# Patient Record
Sex: Male | Born: 1955 | Race: White | Hispanic: Yes | Marital: Married | Smoking: Never smoker
Health system: Southern US, Community
[De-identification: ages and names within clinical notes are randomized; demographics above are authoritative.]

## PROBLEM LIST (undated history)

## (undated) DIAGNOSIS — E119 Type 2 diabetes mellitus without complications: Secondary | ICD-10-CM

## (undated) HISTORY — DX: Type 2 diabetes mellitus without complications: E11.9

---

## 2001-11-07 ENCOUNTER — Emergency Department (HOSPITAL_COMMUNITY): Admission: EM | Admit: 2001-11-07 | Discharge: 2001-11-07 | Payer: Self-pay | Admitting: Emergency Medicine

## 2001-11-07 ENCOUNTER — Encounter: Payer: Self-pay | Admitting: Emergency Medicine

## 2012-06-18 ENCOUNTER — Ambulatory Visit: Payer: Self-pay | Admitting: Emergency Medicine

## 2012-06-18 ENCOUNTER — Ambulatory Visit: Payer: Self-pay

## 2012-06-18 VITALS — BP 138/82 | HR 86 | Temp 98.4°F | Resp 16 | Ht 67.0 in | Wt 198.4 lb

## 2012-06-18 DIAGNOSIS — S61012A Laceration without foreign body of left thumb without damage to nail, initial encounter: Secondary | ICD-10-CM

## 2012-06-18 DIAGNOSIS — M25549 Pain in joints of unspecified hand: Secondary | ICD-10-CM

## 2012-06-18 DIAGNOSIS — S61209A Unspecified open wound of unspecified finger without damage to nail, initial encounter: Secondary | ICD-10-CM

## 2012-06-18 MED ORDER — HYDROCODONE-ACETAMINOPHEN 5-325 MG PO TABS: 1.0000 | ORAL_TABLET | ORAL | Status: DC | PRN

## 2012-06-18 MED ORDER — CEPHALEXIN 500 MG PO CAPS: 500.0000 mg | ORAL_CAPSULE | Freq: Four times a day (QID) | ORAL | Status: DC

## 2012-06-18 NOTE — Progress Notes (Signed)
   Patient ID: Gary Avila MRN: [id number], DOB: [birth year], 57 y.o. Date of Encounter: 06/18/2012, 6:51 PM   PROCEDURE NOTE: Verbal consent obtained.  No FB. Flap repaired with # 4 simple interrupted sutures of 4-0 Ethilon.  Penrose removed.  Pressure dressing applied.  Wound care instructions including precautions covered with patient. Handout given.  Anticipate suture removal in 10 days.   SignedEula Listen, PA-C 06/18/2012 6:51 PM

## 2012-06-18 NOTE — Progress Notes (Signed)
[facility] [address], [place] [address] [phone]  Date:  06/18/2012   Name:  Gary Avila   DOB:  Jul 07, 1955   MRN:  [id number]  PCP:  No primary provider on file.    Chief Complaint: Laceration   History of Present Illness:  Gary Avila is a 57 y.o. very pleasant male patient who presents with the following:  Injured at home with a router and a small bit that jumped and ran across his left thumb on the flexor surface.  Up to date on tetanus.  There is no problem list on file for this patient.   Past Medical History  Diagnosis Date  . Diabetes mellitus without complication     No past surgical history on file.  History  Substance Use Topics  . Smoking status: Never Smoker   . Smokeless tobacco: Not on file  . Alcohol Use: No    No family history on file.  No Known Allergies  Medication list has been reviewed and updated.  No current outpatient prescriptions on file prior to visit.   No current facility-administered medications on file prior to visit.    Review of Systems:  As per HPI, otherwise negative.    Physical Examination: Filed Vitals:   06/18/12 1655  BP: 138/82  Pulse: 86  Temp: 98.4 F (36.9 C)  Resp: 16   Filed Vitals:   06/18/12 1655  Height: 5\' 7"  (1.702 m)  Weight: 198 lb 6.4 oz (89.994 kg)   Body mass index is 31.07 kg/(m^2). Ideal Body Weight: Weight in (lb) to have BMI = 25: 159.3   GEN: WDWN, NAD, Non-toxic, Alert & Oriented x 3 HEENT: Atraumatic, Normocephalic.  Ears and Nose: No external deformity. EXTR: No clubbing/cyanosis/edema NEURO: Normal gait.  PSYCH: Normally interactive. Conversant. Not depressed or anxious appearing.  Calm demeanor.  Macerated flexor surface thumb distal phalange.  Contaminated.  NATI.  Total length 3 cm.  Proximal base flap.  Assessment and Plan: Laceration thumb  Digital block Irrigated and sharply debrided. Tourniquet applied after  exsanguination Repaired by Eula Listen   Signed,  Phillips Odor, MD   [facility] reading (PRIMARY) by  Dr. Dareen Piano.  No osseous injury or FB.

## 2012-06-20 ENCOUNTER — Ambulatory Visit (INDEPENDENT_AMBULATORY_CARE_PROVIDER_SITE_OTHER): Payer: Self-pay | Admitting: Physician Assistant

## 2012-06-20 VITALS — BP 120/78 | HR 80 | Temp 98.8°F | Resp 18 | Ht 66.5 in | Wt 195.0 lb

## 2012-06-20 DIAGNOSIS — Z5189 Encounter for other specified aftercare: Secondary | ICD-10-CM

## 2012-06-20 DIAGNOSIS — S61012D Laceration without foreign body of left thumb without damage to nail, subsequent encounter: Secondary | ICD-10-CM

## 2012-06-20 NOTE — Progress Notes (Signed)
  Subjective:    Patient ID: Gary Avila, male    DOB: 1955/08/21, 57 y.o.   MRN: [id number]  HPI   [name] is a very pleasant 57 yr old male here for follow up on a thumb injury sustained 2 days ago.  See previous note for details of injury.  Today pt states he is doing well though he does have throbbing pain in the thumb especially when his arm is down at his side.  He has kept the area clean and dry.  Original dressing is intact.      Review of Systems  Skin: Positive for wound.  All other systems reviewed and are negative.       Objective:   Physical Exam  Vitals reviewed. Constitutional: He is oriented to person, place, and time. He appears well-developed and well-nourished. No distress.  HENT:  Head: Normocephalic and atraumatic.  Eyes: Conjunctivae are normal. No scleral icterus.  Pulmonary/Chest: Effort normal.  Musculoskeletal:       Left hand: He exhibits laceration. Normal sensation noted.       Hands: Large open wound over distal aspect of left thumb, #4 sutures in place; wound bed beefy red and healthy; granulation tissue visible  Neurological: He is alert and oriented to person, place, and time.  Skin: Skin is warm and dry.  Psychiatric: He has a normal mood and affect. His behavior is normal.          Assessment & Plan:  Laceration of thumb, left, subsequent encounter   [name] is a very pleasant 57 yr old male here for follow-up on a thumb laceration.  Wound appears healthy with good granulation tissue.  I pulled some of the suture tails free from granulation tissue that had grown over top.  Xeroform dressing reapplied.  Also placed pt in fold-over splint to prevent bending at the DIP.  Will have pt RTC Monday 06/23/12 for recheck.  Anticipate suture removal in approx 8 more days.

## 2012-06-20 NOTE — Patient Instructions (Addendum)
Keep the dressing in place.  Use the splint to help keep your thumb from bending.  Come back on Monday so we can recheck you, sooner if anything is getting worse.

## 2012-06-23 ENCOUNTER — Encounter: Payer: Self-pay | Admitting: Family Medicine

## 2012-06-23 ENCOUNTER — Ambulatory Visit (INDEPENDENT_AMBULATORY_CARE_PROVIDER_SITE_OTHER): Payer: Self-pay | Admitting: Physician Assistant

## 2012-06-23 VITALS — BP 136/88 | HR 73 | Temp 98.5°F | Resp 16 | Ht 66.0 in | Wt 195.0 lb

## 2012-06-23 DIAGNOSIS — M79645 Pain in left finger(s): Secondary | ICD-10-CM

## 2012-06-23 DIAGNOSIS — M79609 Pain in unspecified limb: Secondary | ICD-10-CM

## 2012-06-23 DIAGNOSIS — Z5189 Encounter for other specified aftercare: Secondary | ICD-10-CM

## 2012-06-23 DIAGNOSIS — S61012D Laceration without foreign body of left thumb without damage to nail, subsequent encounter: Secondary | ICD-10-CM

## 2012-06-23 NOTE — Progress Notes (Signed)
   [address] [place] [address]   Phone [phone]  Subjective:    Patient ID: Gary Avila, male    DOB: 1956/02/09, 57 y.o.   MRN: [id number]  HPI  Pt presents to clinic for f/u.  Less pain - he has not changed the drsg.  Review of Systems  Constitutional: Negative for fever and chills.  Skin: Positive for wound.       Objective:   Physical Exam  Vitals reviewed. Constitutional: He is oriented to person, place, and time. He appears well-developed and well-nourished.  HENT:  Head: Normocephalic and atraumatic.  Right Ear: External ear normal.  Left Ear: External ear normal.  Pulmonary/Chest: Effort normal.  Neurological: He is alert and oriented to person, place, and time.  Skin: Skin is warm.  Healing wound with good granulation tissue formation.  Ends of sutures are removed from the healing wound.  Intact skin edges are macerated from xeroform drsg.  No erythema or other signs of infection.  Psychiatric: He has a normal mood and affect. His behavior is normal. Judgment and thought content normal.       Assessment & Plan:  Thumb laceration, left, subsequent encounter Non-stick drsg placed on wound - pt to continue fold over splint for pain protection.  Change drsg daily.  Recheck on Friday for probably suture removal.  Early if concerns about wound.  Pain of left thumb  Benny Lennert [facility] 06/23/2012 11:32 AM

## 2012-06-25 ENCOUNTER — Ambulatory Visit (INDEPENDENT_AMBULATORY_CARE_PROVIDER_SITE_OTHER): Payer: Self-pay | Admitting: Physician Assistant

## 2012-06-25 VITALS — BP 132/84 | HR 84 | Temp 98.6°F | Resp 18 | Ht 66.0 in | Wt 194.0 lb

## 2012-06-25 DIAGNOSIS — Z5189 Encounter for other specified aftercare: Secondary | ICD-10-CM

## 2012-06-25 DIAGNOSIS — S61012D Laceration without foreign body of left thumb without damage to nail, subsequent encounter: Secondary | ICD-10-CM

## 2012-06-25 DIAGNOSIS — S61209A Unspecified open wound of unspecified finger without damage to nail, initial encounter: Secondary | ICD-10-CM

## 2012-06-25 NOTE — Progress Notes (Signed)
   [address], [place] [address]   Phone [phone]  Subjective:    Patient ID: Gary Avila, male    DOB: 21-Feb-1956, 57 y.o.   MRN: [id number]  HPI Pt presents to clinic for recheck. He is doing well.  He has been changing the drsg daily.   Review of Systems     Objective:   Physical Exam  Constitutional: He is oriented to person, place, and time. He appears well-developed and well-nourished.  HENT:  Head: Normocephalic and atraumatic.  Right Ear: External ear normal.  Left Ear: External ear normal.  Pulmonary/Chest: Effort normal.  Neurological: He is alert and oriented to person, place, and time.  Skin: Skin is warm and dry.  L thumb drsg removed.  Suture tails are again embedded in the granulation tissue.  I removed #1 suture at the thumb base.  Xeroform gauze placed over the wound.  Non-stick drsg placed with fold over splint.  Psychiatric: He has a normal mood and affect. His behavior is normal. Judgment and thought content normal.       Assessment & Plan:  Wound thumb - continue wearing splint.  Recheck on Friday Benny Lennert PA-C 06/25/2012 1:17 PM

## 2012-06-27 ENCOUNTER — Ambulatory Visit (INDEPENDENT_AMBULATORY_CARE_PROVIDER_SITE_OTHER): Payer: Self-pay | Admitting: Physician Assistant

## 2012-06-27 VITALS — HR 72 | Temp 98.2°F | Resp 18 | Ht 66.0 in | Wt 194.0 lb

## 2012-06-27 DIAGNOSIS — S61012D Laceration without foreign body of left thumb without damage to nail, subsequent encounter: Secondary | ICD-10-CM

## 2012-06-27 DIAGNOSIS — Z5189 Encounter for other specified aftercare: Secondary | ICD-10-CM

## 2012-06-27 NOTE — Progress Notes (Signed)
   [address] [place] [address]   Phone [phone]  Subjective:    Patient ID: Gary Avila, male    DOB: 18-Mar-1955, 57 y.o.   MRN: [id number]  HPI  Pt presents to clinic for wound recheck.  He has not changed the drsg.  Review of Systems     Objective:   Physical Exam  Vitals reviewed. Constitutional: He is oriented to person, place, and time. He appears well-developed and well-nourished.  Pulmonary/Chest: Effort normal.  Neurological: He is alert and oriented to person, place, and time.  Skin: Skin is warm and dry.  Drsg removed.  Wound is healing well.  Less beefy red granulation tissue present - good new pink tissue at the wound edges.  Stitches removed and tip of thumb is still loose due to injury but more stable than it was last week.  Xeroform drsg placed.    Psychiatric: He has a normal mood and affect. His behavior is normal. Judgment and thought content normal.          Assessment & Plan:  Laceration of left thumb, subsequent encounter -  Pt to change drsg and xerform at leasdt every other day - once there is pink tissue covering the wound he can stop the xeroform and just use dry bandage.  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  Pt understands and agrees.  Benny Lennert PA-C 06/27/2012 12:51 PM

## 2016-07-11 ENCOUNTER — Ambulatory Visit (HOSPITAL_COMMUNITY)
Admission: RE | Admit: 2016-07-11 | Discharge: 2016-07-11 | Disposition: A | Discharge status: Home / Self Care | Payer: Self-pay | Source: Ambulatory Visit | Attending: Physician Assistant | Admitting: Physician Assistant

## 2016-07-11 ENCOUNTER — Encounter: Payer: Self-pay | Admitting: Physician Assistant

## 2016-07-11 ENCOUNTER — Ambulatory Visit (INDEPENDENT_AMBULATORY_CARE_PROVIDER_SITE_OTHER): Payer: Self-pay | Admitting: Physician Assistant

## 2016-07-11 ENCOUNTER — Encounter (HOSPITAL_COMMUNITY): Payer: Self-pay

## 2016-07-11 VITALS — BP 119/83 | HR 79 | Temp 99.2°F | Resp 20 | Ht 66.0 in | Wt 189.2 lb

## 2016-07-11 DIAGNOSIS — R000 Tachycardia, unspecified: Secondary | ICD-10-CM

## 2016-07-11 DIAGNOSIS — E118 Type 2 diabetes mellitus with unspecified complications: Secondary | ICD-10-CM

## 2016-07-11 DIAGNOSIS — R918 Other nonspecific abnormal finding of lung field: Secondary | ICD-10-CM | POA: Insufficient documentation

## 2016-07-11 DIAGNOSIS — R42 Dizziness and giddiness: Secondary | ICD-10-CM

## 2016-07-11 DIAGNOSIS — R9431 Abnormal electrocardiogram [ECG] [EKG]: Secondary | ICD-10-CM

## 2016-07-11 DIAGNOSIS — R739 Hyperglycemia, unspecified: Secondary | ICD-10-CM

## 2016-07-11 DIAGNOSIS — J9811 Atelectasis: Secondary | ICD-10-CM | POA: Insufficient documentation

## 2016-07-11 DIAGNOSIS — I700 Atherosclerosis of aorta: Secondary | ICD-10-CM | POA: Insufficient documentation

## 2016-07-11 LAB — POCT CBC
Granulocyte percent: 89.4 % — AB (ref 37–80)
HCT, POC: 42.2 % — AB (ref 43.5–53.7)
Hemoglobin: 14.5 g/dL (ref 14.1–18.1)
Lymph, poc: 0.5 — AB (ref 0.6–3.4)
MCH, POC: 27.9 pg (ref 27–31.2)
MCHC: 34.4 g/dL (ref 31.8–35.4)
MCV: 81.2 fL (ref 80–97)
MID (cbc): 0.1 (ref 0–0.9)
MPV: 9.2 fL (ref 0–99.8)
POC Granulocyte: 4.9 (ref 2–6.9)
POC LYMPH PERCENT: 8.3 % — AB (ref 10–50)
POC MID %: 2.3 %M (ref 0–12)
Platelet Count, POC: 144 10*3/uL (ref 142–424)
RBC: 5.2 M/uL (ref 4.69–6.13)
RDW, POC: 13.7 %
WBC: 5.5 10*3/uL (ref 4.6–10.2)

## 2016-07-11 LAB — CMP14+EGFR
ALT: 27 IU/L (ref 0–44)
AST: 24 IU/L (ref 0–40)
Albumin/Globulin Ratio: 1.7 (ref 1.2–2.2)
Albumin: 4.3 g/dL (ref 3.5–5.5)
Alkaline Phosphatase: 89 IU/L (ref 39–117)
BUN/Creatinine Ratio: 17 (ref 9–20)
BUN: 18 mg/dL (ref 6–24)
Bilirubin Total: 0.6 mg/dL (ref 0.0–1.2)
CO2: 20 mmol/L (ref 18–29)
Calcium: 8.9 mg/dL (ref 8.7–10.2)
Chloride: 98 mmol/L (ref 96–106)
Creatinine, Ser: 1.03 mg/dL (ref 0.76–1.27)
GFR calc Af Amer: 92 mL/min/{1.73_m2} (ref >59)
GFR calc non Af Amer: 80 mL/min/{1.73_m2} (ref >59)
Globulin, Total: 2.5 g/dL (ref 1.5–4.5)
Glucose: 223 mg/dL — ABNORMAL HIGH (ref 65–99)
Potassium: 3.9 mmol/L (ref 3.5–5.2)
Sodium: 137 mmol/L (ref 134–144)
Total Protein: 6.8 g/dL (ref 6.0–8.5)

## 2016-07-11 LAB — POC MICROSCOPIC URINALYSIS (UMFC): Mucus: ABSENT

## 2016-07-11 LAB — POCT URINALYSIS DIP (MANUAL ENTRY)
Bilirubin, UA: NEGATIVE
Glucose, UA: 100 mg/dL — AB
Ketones, POC UA: NEGATIVE mg/dL
Nitrite, UA: NEGATIVE
Protein Ur, POC: NEGATIVE mg/dL
Spec Grav, UA: 1.015 (ref 1.010–1.025)
Urobilinogen, UA: 0.2 E.U./dL
pH, UA: 5 (ref 5.0–8.0)

## 2016-07-11 LAB — HEMOGLOBIN A1C
Est. average glucose Bld gHb Est-mCnc: 229 mg/dL
Hgb A1c MFr Bld: 9.6 % — ABNORMAL HIGH (ref 4.8–5.6)

## 2016-07-11 LAB — D-DIMER, QUANTITATIVE: D-DIMER: 3.16 mg{FEU}/L — ABNORMAL HIGH (ref 0.00–0.49)

## 2016-07-11 LAB — POCT I-STAT CREATININE: Creatinine, Ser: 0.9 mg/dL (ref 0.61–1.24)

## 2016-07-11 LAB — GLUCOSE, POCT (MANUAL RESULT ENTRY): POC Glucose: 219 mg/dl — AB (ref 70–99)

## 2016-07-11 MED ORDER — IOPAMIDOL (ISOVUE-370) INJECTION 76%
INTRAVENOUS | Status: AC
  Administered 2016-07-11: 100 mL
  Filled 2016-07-11: qty 100

## 2016-07-11 MED ORDER — METFORMIN HCL 500 MG PO TABS: 500.0000 mg | ORAL_TABLET | Freq: Two times a day (BID) | ORAL | 3 refills | Status: DC

## 2016-07-11 NOTE — Patient Instructions (Addendum)
Please come back tomorrow and se Dr. Mitchel Honour.   Please start your Metformin: Metformin Dosing (to be taken with food) Week 1: take 1/2 tablet twice a day. Week 2: take 1 tablet in the morning, 1/2 tablet at night. Week 3: take 2 tablets twice a day  Come back and see me in 2 weeks.   Thank you for coming in today. I hope you feel we met your needs.  Feel free to call [facility] if you have any questions or further requests.  Please consider signing up for MyChart if you do not already have it, as this is a great way to communicate with me.  Best,  Whitney McVey, PA-C   Carbohydrate Counting for Diabetes Mellitus, Adult Carbohydrate counting is a method for keeping track of how many carbohydrates you eat. Eating carbohydrates naturally increases the amount of sugar (glucose) in the blood. Counting how many carbohydrates you eat helps keep your blood glucose within normal limits, which helps you manage your diabetes (diabetes mellitus). It is important to know how many carbohydrates you can safely have in each meal. This is different for every person. A diet and nutrition specialist (registered dietitian) can help you make a meal plan and calculate how many carbohydrates you should have at each meal and snack. Carbohydrates are found in the following foods:  Grains, such as breads and cereals.  Dried beans and soy products.  Starchy vegetables, such as potatoes, peas, and corn.  Fruit and fruit juices.  Milk and yogurt.  Sweets and snack foods, such as cake, cookies, candy, chips, and soft drinks. How do I count carbohydrates? There are two ways to count carbohydrates in food. You can use either of the methods or a combination of both. Reading "Nutrition Facts" on packaged food  The "Nutrition Facts" list is included on the labels of almost all packaged foods and beverages in the [place] It includes:  The serving size.  Information about nutrients in each serving, including the grams  (g) of carbohydrate per serving. To use the "Nutrition Facts":  Decide how many servings you will have.  Multiply the number of servings by the number of carbohydrates per serving.  The resulting number is the total amount of carbohydrates that you will be having. Learning standard serving sizes of other foods  When you eat foods containing carbohydrates that are not packaged or do not include "Nutrition Facts" on the label, you need to measure the servings in order to count the amount of carbohydrates:  Measure the foods that you will eat with a food scale or measuring cup, if needed.  Decide how many standard-size servings you will eat.  Multiply the number of servings by 15. Most carbohydrate-rich foods have about 15 g of carbohydrates per serving.  For example, if you eat 8 oz (170 g) of strawberries, you will have eaten 2 servings and 30 g of carbohydrates (2 servings x 15 g = 30 g).  For foods that have more than one food mixed, such as soups and casseroles, you must count the carbohydrates in each food that is included. The following list contains standard serving sizes of common carbohydrate-rich foods. Each of these servings has about 15 g of carbohydrates:   hamburger bun or  English muffin.   oz (15 mL) syrup.   oz (14 g) jelly.  1 slice of bread.  1 six-inch tortilla.  3 oz (85 g) cooked rice or pasta.  4 oz (113 g) cooked dried beans.  4 oz (113 g) starchy vegetable, such as peas, corn, or potatoes.  4 oz (113 g) hot cereal.  4 oz (113 g) mashed potatoes or  of a large baked potato.  4 oz (113 g) canned or frozen fruit.  4 oz (120 mL) fruit juice.  4-6 crackers.  6 chicken nuggets.  6 oz (170 g) unsweetened dry cereal.  6 oz (170 g) plain fat-free yogurt or yogurt sweetened with artificial sweeteners.  8 oz (240 mL) milk.  8 oz (170 g) fresh fruit or one small piece of fruit.  24 oz (680 g) popped popcorn. Example of carbohydrate  counting Sample meal   3 oz (85 g) chicken breast.  6 oz (170 g) brown rice.  4 oz (113 g) corn.  8 oz (240 mL) milk.  8 oz (170 g) strawberries with sugar-free whipped topping. Carbohydrate calculation  1. Identify the foods that contain carbohydrates:  Rice.  Corn.  Milk.  Strawberries. 2. Calculate how many servings you have of each food:  2 servings rice.  1 serving corn.  1 serving milk.  1 serving strawberries. 3. Multiply each number of servings by 15 g:  2 servings rice x 15 g = 30 g.  1 serving corn x 15 g = 15 g.  1 serving milk x 15 g = 15 g.  1 serving strawberries x 15 g = 15 g. 4. Add together all of the amounts to find the total grams of carbohydrates eaten:  30 g + 15 g + 15 g + 15 g = 75 g of carbohydrates total. This information is not intended to replace advice given to you by your health care provider. Make sure you discuss any questions you have with your health care provider. Document Released: 02/19/2005 Document Revised: 09/09/2015 Document Reviewed: 08/03/2015 Elsevier Interactive Patient Education  2017 [employer].  IF you received an x-ray today, you will receive an invoice from [facility]. Please contact [facility] at [phone] with questions or concerns regarding your invoice.   IF you received labwork today, you will receive an invoice from [facility]. Please contact [facility] at [phone] with questions or concerns regarding your invoice.   Our billing staff will not be able to assist you with questions regarding bills from these companies.  You will be contacted with the lab results as soon as they are available. The fastest way to get your results is to activate your My Chart account. Instructions are located on the last page of this paperwork. If you have not heard from [place] regarding the results in 2 weeks, please contact this office.

## 2016-07-11 NOTE — Progress Notes (Signed)
Pt will f/u with Dr. Alvy BimlerSagardia tomorrow to discuss results and reassess his clinical condition.

## 2016-07-11 NOTE — Progress Notes (Signed)
Gary Avila  MRN: [id number] DOB: June 09, 1955  PCP: Patient, No Pcp Per  Subjective:  Pt is a 61 year old male who presents to clinic for dizziness that started this morning.  He was at work this morning when he felt lightheaded, weak and dizzy. He stopped working to sit down. At that time he felt cold chills with trembling in his hands. He endorses abnormal gait due to dizziness. He has not taken anything to feel better.  No recent medication changes.    Denies chest pain, neck pain, arm pain, cough, pain with deep inspiration, abdominal pain, one-sided weakness, one-sided numbness, facial numbness, difficulty with speech, vision changes, headache, n/v/d.   H/o diabetes - has not taken Metformin in about 6 months.   Review of Systems  Constitutional: Positive for chills. Negative for diaphoresis and fever.  Respiratory: Negative for cough, chest tightness, shortness of breath and wheezing.   Cardiovascular: Negative for chest pain, palpitations and leg swelling.  Gastrointestinal: Negative for diarrhea, nausea and vomiting.  Musculoskeletal: Positive for gait problem (due to dizziness). Negative for neck pain.  Neurological: Positive for dizziness and tremors. Negative for syncope, light-headedness and headaches.  Psychiatric/Behavioral: The patient is not nervous/anxious.     There are no active problems to display for this patient.   Current Outpatient Prescriptions on File Prior to Visit  Medication Sig Dispense Refill  . cephALEXin (KEFLEX) 500 MG capsule Take 1 capsule (500 mg total) by mouth 4 (four) times daily. (Patient not taking: Reported on 07/11/2016) 30 capsule 0  . HYDROcodone-acetaminophen (NORCO) 5-325 MG per tablet Take 1 tablet by mouth every 4 (four) hours as needed. (Patient not taking: Reported on 07/11/2016) 30 tablet 0  . metFORMIN (GLUCOPHAGE) 500 MG tablet Take 500 mg by mouth 2 (two) times daily with a meal.    . terazosin (HYTRIN) 1 MG capsule Take  1 mg by mouth at bedtime.     No current facility-administered medications on file prior to visit.     No Known Allergies   Objective:  BP 119/83 (BP Location: Right Arm, Patient Position: Sitting, Cuff Size: Large)   Pulse 79   Temp 99.2 F (37.3 C) (Oral)   Resp 20   Ht '5\' 6"'$  (1.676 m)   Wt 189 lb 3.2 oz (85.8 kg)   SpO2 98%   BMI 30.54 kg/m   Physical Exam  Constitutional: He is oriented to person, place, and time and well-developed, well-nourished, and in no distress. No distress.  Eyes: EOM are normal. Pupils are equal, round, and reactive to light.  Cardiovascular: Regular rhythm, normal heart sounds and normal pulses.  Tachycardia present.   Pulmonary/Chest: Effort normal and breath sounds normal. No respiratory distress.  Neurological: He is alert and oriented to person, place, and time. He has normal sensation, normal strength and intact cranial nerves. He is not agitated and not disoriented. He displays no weakness, no tremor, facial symmetry, normal stance and normal speech. No sensory deficit. He has a normal Tandem Gait Test. He shows no pronator drift. Gait abnormal. Coordination normal. GCS score is 15.  Gait normal at recheck.   Skin: Skin is warm and dry.  Psychiatric: Mood, memory, affect and judgment normal.  Vitals reviewed.  Orthostatic VS for the past 24 hrs:  BP- Lying Pulse- Lying BP- Sitting Pulse- Sitting BP- Standing at 0 minutes Pulse- Standing at 0 minutes  07/11/16 1254 118/76 113 113/73 121 118/70 124    Results for orders placed  or performed in visit on 07/11/16  POCT glucose (manual entry)  Result Value Ref Range   POC Glucose 219 (A) 70 - 99 mg/dl  POCT Microscopic Urinalysis ([facility])  Result Value Ref Range   WBC,UR,HPF,POC Few (A) None WBC/hpf   RBC,UR,HPF,POC Moderate (A) None RBC/hpf   Bacteria None None, Too numerous to count   Mucus Absent Absent   Epithelial Cells, UR Per Microscopy Few (A) None, Too numerous to count cells/hpf    POCT urinalysis dipstick  Result Value Ref Range   Color, UA yellow yellow   Clarity, UA clear clear   Glucose, UA =100 (A) negative mg/dL   Bilirubin, UA negative negative   Ketones, POC UA negative negative mg/dL   Spec Grav, UA 1.015 1.010 - 1.025   Blood, UA small (A) negative   pH, UA 5.0 5.0 - 8.0   Protein Ur, POC negative negative mg/dL   Urobilinogen, UA 0.2 0.2 or 1.0 E.U./dL   Nitrite, UA Negative Negative   Leukocytes, UA Trace (A) Negative  POCT CBC  Result Value Ref Range   WBC 5.5 4.6 - 10.2 K/uL   Lymph, poc 0.5 (A) 0.6 - 3.4   POC LYMPH PERCENT 8.3 (A) 10 - 50 %L   MID (cbc) 0.1 0 - 0.9   POC MID % 2.3 0 - 12 %M   POC Granulocyte 4.9 2 - 6.9   Granulocyte percent 89.4 (A) 37 - 80 %G   RBC 5.20 4.69 - 6.13 M/uL   Hemoglobin 14.5 14.1 - 18.1 g/dL   HCT, POC 42.2 (A) 43.5 - 53.7 %   MCV 81.2 80 - 97 fL   MCH, POC 27.9 27 - 31.2 pg   MCHC 34.4 31.8 - 35.4 g/dL   RDW, POC 13.7 %   Platelet Count, POC 144 142 - 424 K/uL   MPV 9.2 0 - 99.8 fL    EKG - nsr, tachycardia. Changes in S1 and T3.  Assessment and Plan :  This case was discussed with Dr. Mitchel Honour.  1. Dizziness 2. Tachycardia 3. Nonspecific abnormal electrocardiogram (ECG) (EKG) - POCT glucose (manual entry) - CMP14+EGFR - EKG 12-Lead - POCT Microscopic Urinalysis ([facility]) - POCT urinalysis dipstick - Urine culture - Orthostatic vital signs - POCT CBC - Creatinine, IStat; Future - D-dimer, quantitative (not at [facility]) - CT Angio Chest W/Cm &/Or Wo Cm; Future - Creatinine, IStat; Future - Pt reports improvement of symptoms during his time here - Dizziness improved, as did unsteady gait. Not concerned today for neuro etiology. Will send for stat CT angio due to persistent tachycardia and EKG abnormalities to r/o pulmonary embolism. Labs are pending. F/u tomorrow with Dr. Mitchel Honour.  4. Hyperglycemia 5. Type 2 diabetes mellitus with complication, without long-term current use of insulin (HCC) -  Hemoglobin A1c - metFORMIN (GLUCOPHAGE) 500 MG tablet; Take 1 tablet (500 mg total) by mouth 2 (two) times daily with a meal.  Dispense: 60 tablet; Refill: 3 - Blood sugar is elevated. Possible his uncotrolled blood sugar levels are causing Mr. Ethel Rana symptoms.  Will send A1C as well. Plan to taper up on Metformin. RTC in 2 weeks for recheck.   Mercer Pod, PA-C  [facility] 07/11/2016 12:00 PM

## 2016-07-12 ENCOUNTER — Encounter: Payer: Self-pay | Admitting: Emergency Medicine

## 2016-07-12 ENCOUNTER — Ambulatory Visit (INDEPENDENT_AMBULATORY_CARE_PROVIDER_SITE_OTHER): Payer: Self-pay | Admitting: Emergency Medicine

## 2016-07-12 VITALS — BP 109/71 | HR 82 | Temp 98.8°F | Resp 16 | Ht 66.0 in | Wt 190.2 lb

## 2016-07-12 DIAGNOSIS — E118 Type 2 diabetes mellitus with unspecified complications: Secondary | ICD-10-CM

## 2016-07-12 DIAGNOSIS — R42 Dizziness and giddiness: Secondary | ICD-10-CM | POA: Insufficient documentation

## 2016-07-12 DIAGNOSIS — R000 Tachycardia, unspecified: Secondary | ICD-10-CM

## 2016-07-12 LAB — PLEASE NOTE

## 2016-07-12 LAB — URINE CULTURE

## 2016-07-12 MED ORDER — METFORMIN HCL 500 MG PO TABS: 500.0000 mg | ORAL_TABLET | Freq: Two times a day (BID) | ORAL | 3 refills | Status: AC

## 2016-07-12 NOTE — Progress Notes (Signed)
Gary Avila 61 y.o.   Chief Complaint  Patient presents with  . Follow-up    HISTORY OF PRESENT ILLNESS: This is a 61 y.o. male seen here yesterday with dizziness, tachycardia, and an abnormal EKG. Toiday much better and symptom-free. Has h/o DMT2 and was found to be hyperglycemic; has been off meds.  HPI   Prior to Admission medications   Medication Sig Start Date End Date Taking? Authorizing Provider  metFORMIN (GLUCOPHAGE) 500 MG tablet Take 1 tablet (500 mg total) by mouth 2 (two) times daily with a meal. Patient not taking: Reported on 07/12/2016 07/11/16   McVey, Madelaine BhatElizabeth Whitney, PA-C  terazosin (HYTRIN) 1 MG capsule Take 1 mg by mouth at bedtime.    [provider]    No Known Allergies  There are no active problems to display for this patient.   Past Medical History:  Diagnosis Date  . Diabetes mellitus without complication (HCC)     History reviewed. No pertinent surgical history.  Social History   Social History  . Marital status: Married    Spouse name: N/A  . Number of children: N/A  . Years of education: N/A   Occupational History  . Not on file.   Social History Main Topics  . Smoking status: Never Smoker  . Smokeless tobacco: Never Used  . Alcohol use No  . Drug use: No  . Sexual activity: Yes   Other Topics Concern  . Not on file   Social History Narrative  . No narrative on file    History reviewed. No pertinent family history.   ROS  Unremarkable  Vitals:   07/12/16 1705  BP: 109/71  Pulse: 82  Resp: 16  Temp: 98.8 F (37.1 C)    Physical Exam  Constitutional: He is oriented to person, place, and time. He appears well-developed and well-nourished.  HENT:  Head: Normocephalic and atraumatic.  Eyes: Pupils are equal, round, and reactive to light.  Neck: Normal range of motion.  Cardiovascular: Normal rate.   Pulmonary/Chest: Effort normal.  Musculoskeletal: Normal range of motion.  Neurological: He is  alert and oriented to person, place, and time.  Skin: Skin is warm and dry.  Psychiatric: He has a normal mood and affect. His behavior is normal.  Vitals reviewed.    ASSESSMENT & PLAN: [name] was seen today for follow-up.  Diagnoses and all orders for this visit:  Dizziness Comments: resolved  Type 2 diabetes mellitus with complication, without long-term current use of insulin (HCC) -     metFORMIN (GLUCOPHAGE) 500 MG tablet; Take 1 tablet (500 mg total) by mouth 2 (two) times daily with a meal.  Tachycardia Comments: resolved    Patient Instructions       IF you received an x-ray today, you will receive an invoice [facility]. Please contact [facility] at [phone] with questions or concerns regarding your invoice.   IF you received labwork today, you will receive an invoice from [facility]. Please contact [facility] at [phone] with questions or concerns regarding your invoice.   Our billing staff will not be able to assist you with questions regarding bills from these companies.  You will be contacted with the lab results as soon as they are available. The fastest way to get your results is to activate your My Chart account. Instructions are located on the last page of this paperwork. If you have not heard from us regarding the results in 2 weeks, please contact this office.  Diagnstico de la diabetes mellitus tipo2 en los adultos (Type 2 Diabetes Mellitus, Diagnosis, Adult) La diabetes tipo2 (diabetes mellitus tipo2) es una enfermedad de larga duracin (crnica). Puede deberse a uno de [employer] o a ambos:  El cuerpo no produce la cantidad suficiente de una hormona llamada insulina.  El cuerpo no reacciona de forma normal a la insulina que produce. La insulina permite que los ciertos azcares (glucosa) ingresen a las clulas del cuerpo. Esto le proporciona la energa. Cuando se tiene diabetes tipo2, la glucosa no pueden  ingresar a las clulas. Esto produce un aumento del nivel de glucosa en la sangre (hiperglucemia). El mdico fijar los objetivos del tratamiento para usted. Generalmente, los resultados de los niveles de glucosa en la sangre deben ser los siguientes:  Antes de las comidas (preprandial): de 80 a 130mg /dl (4,4 a 1,6XWRU/E).  Despus de las comidas (posprandial): por debajo de 180mg /dl (45WUJW/J).  Nivel deA1c (hemoglobinaA1c): menos del7%. CUIDADOS EN EL HOGAR Preguntas para hacerle al mdico Puede hacer las siguientes preguntas:  Debo reunirme con IT trainer para el cuidado de la diabetes?  Dnde puedo encontrar un grupo de apoyo para personas diabticas?  Qu equipos necesitar para cuidarme en casa?  Qu medicamentos para la diabetes necesito? Cundo debo tomarlos?  Con qu frecuencia debo controlarme el nivel de glucosa en la sangre?  A qu nmero puedo llamar si tengo preguntas?  Cundo es la prxima cita con el mdico? Instrucciones generales  [employer] medicamentos de venta libre y los recetados solamente como se lo haya indicado el mdico.  Oceanographer a todas las visitas de control como se lo haya indicado el mdico. Esto es importante. SOLICITE AYUDA SI:  El nivel de glucosa en la sangre es mayor o igual que 240mg /dl (19,1YNWG/NF) durante 2das seguidos.  Ha estado enfermo o ha tenido fiebre durante 2o ms [address]  [address] y no [place].  Si tiene alguno de [employer] durante ms de 6horas:  No puede comer ni beber.  Siente malestar estomacal (nuseas).  Vomita.  La materia fecal es lquida (diarrea). SOLICITE AYUDA DE INMEDIATO SI:  El nivel de glucosa en la sangre est por debajo de 54mg /dl (88mmol/l).  Est confundido.  Tiene dificultad para hacer lo siguiente:  Pensar con claridad.  Respirar.  Tiene niveles moderados o altos de cetonas en la [place]. Esta informacin no tiene Theme park manager el consejo del mdico. Asegrese de  hacerle al mdico cualquier pregunta que tenga. Document Released: 05/18/2008 Document Revised: 06/13/2015 Document Reviewed: 03/25/2015 Elsevier Interactive Patient Education  2017 Elsevier Inc.      Edwina Barth, MD [facility]

## 2016-07-12 NOTE — Patient Instructions (Addendum)
     IF you received an x-ray today, you will receive an invoice from [facility]. Please contact [facility] at [phone] with questions or concerns regarding your invoice.   IF you received labwork today, you will receive an invoice from [facility]. Please contact [facility] at [phone] with questions or concerns regarding your invoice.   Our billing staff will not be able to assist you with questions regarding bills from these companies.  You will be contacted with the lab results as soon as they are available. The fastest way to get your results is to activate your My Chart account. Instructions are located on the last page of this paperwork. If you have not heard from us regarding the results in 2 weeks, please contact this office.      Diagnstico de la diabetes mellitus tipo2 en los adultos (Type 2 Diabetes Mellitus, Diagnosis, Adult) La diabetes tipo2 (diabetes mellitus tipo2) es una enfermedad de larga duracin (crnica). Puede deberse a uno de estos problemas o a ambos:  El cuerpo no produce la cantidad suficiente de una hormona llamada insulina.  El cuerpo no reacciona de forma normal a la insulina que produce. La insulina permite que los ciertos azcares (glucosa) ingresen a las clulas del cuerpo. Esto le proporciona la energa. Cuando se tiene diabetes tipo2, la glucosa no pueden ingresar a las clulas. Esto produce un aumento del nivel de glucosa en la sangre (hiperglucemia). El mdico fijar los objetivos del tratamiento para usted. Generalmente, los resultados de los niveles de glucosa en la sangre deben ser los siguientes:  Antes de las comidas (preprandial): de 80 a 130mg/dl (4,4 a 7,2mmol/l).  Despus de las comidas (posprandial): por debajo de 180mg/dl (10mmol/l).  Nivel deA1c (hemoglobinaA1c): menos del7%. CUIDADOS EN EL HOGAR Preguntas para hacerle al mdico Puede hacer las siguientes preguntas:  Debo reunirme con un instructor  para el cuidado de la diabetes?  Dnde puedo encontrar un grupo de apoyo para personas diabticas?  Qu equipos necesitar para cuidarme en casa?  Qu medicamentos para la diabetes necesito? Cundo debo tomarlos?  Con qu frecuencia debo controlarme el nivel de glucosa en la sangre?  A qu nmero puedo llamar si tengo preguntas?  Cundo es la prxima cita con el mdico? Instrucciones generales  T[place] los medicamentos de venta libre y los recetados solamente como se lo haya indicado el mdico.  Concurra a todas las visitas de control como se lo haya indicado el mdico. Esto es importante. SOLICITE AYUDA SI:  El nivel de glucosa en la sangre es mayor o igual que 240mg/dl (13,3mmol/dl) durante 2das seguidos.  Ha estado enfermo o ha tenido fiebre durante 2o ms das y no mejora.  Si tiene alguno de estos problemas durante ms de 6horas:  No puede comer ni beber.  Siente malestar estomacal (nuseas).  Vomita.  La materia fecal es lquida (diarrea). SOLICITE AYUDA DE INMEDIATO SI:  El nivel de glucosa en la sangre est por debajo de 54mg/dl (3mmol/l).  Est confundido.  Tiene dificultad para hacer lo siguiente:  Pensar con claridad.  Respirar.  Tiene niveles moderados o altos de cetonas en la [place]. Esta informacin no tiene como fin reemplazar el consejo del mdico. Asegrese de hacerle al mdico cualquier pregunta que tenga. Document Released: 05/18/2008 Document Revised: 06/13/2015 Document Reviewed: 03/25/2015 Elsevier Interactive Patient Education  2017 Elsevier Inc.  

## 2018-08-19 IMAGING — CT CT ANGIO CHEST
2 of 6 series · 18 of 46 positions shown · IV contrast (APPLIED)
Comparison: None.

CLINICAL DATA: Shortness of breath.  Tachycardia.  Abnormal EKG.

EXAM:
CT ANGIOGRAPHY CHEST WITH CONTRAST
TECHNIQUE: Multidetector CT imaging of the chest was performed using the
standard protocol during bolus administration of intravenous
contrast. Multiplanar CT image reconstructions and MIPs were
obtained to evaluate the vascular anatomy.
CONTRAST:  100 mL Isovue 370

[Series 5: thins · axial · 0.76mm/px · z∈[-533,-282]mm · 16 of 275 slices shown]
[im 12/275  lung]
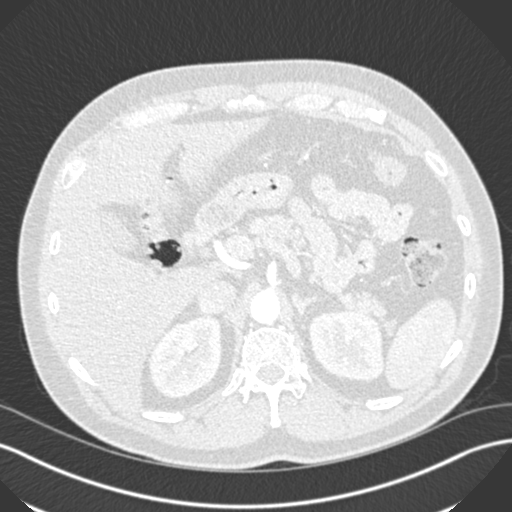
[im 36/275  soft-tissue]
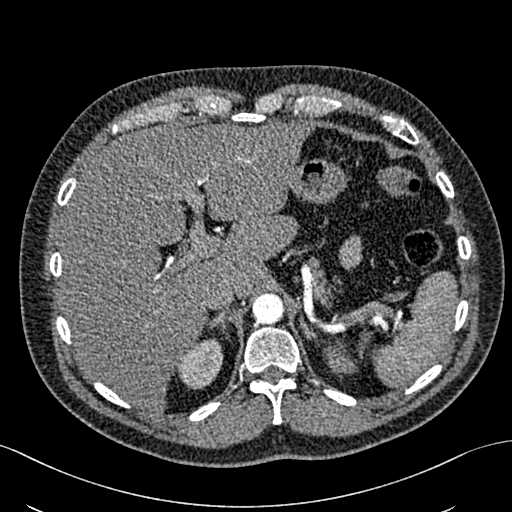
[im 48/275  lung]
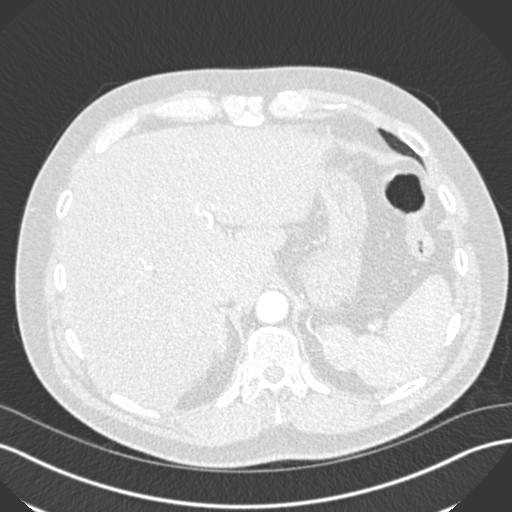
[im 60/275  soft-tissue]
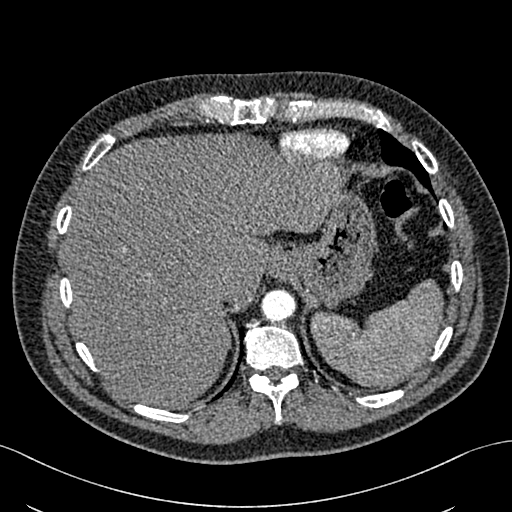
[im 84/275  lung]
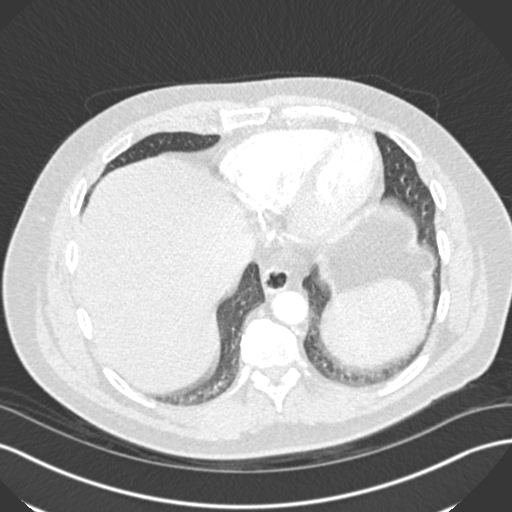
[im 96/275  soft-tissue]
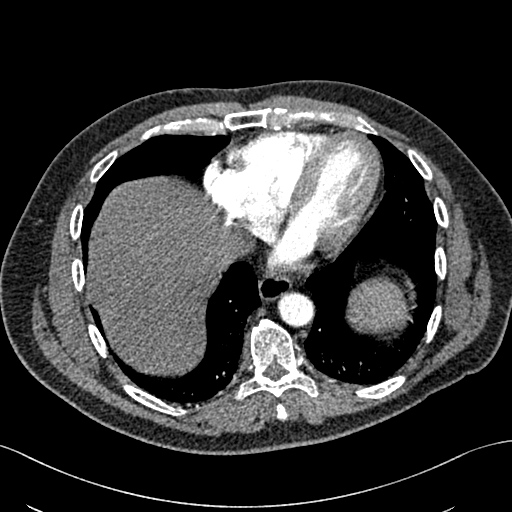
[im 108/275  lung]
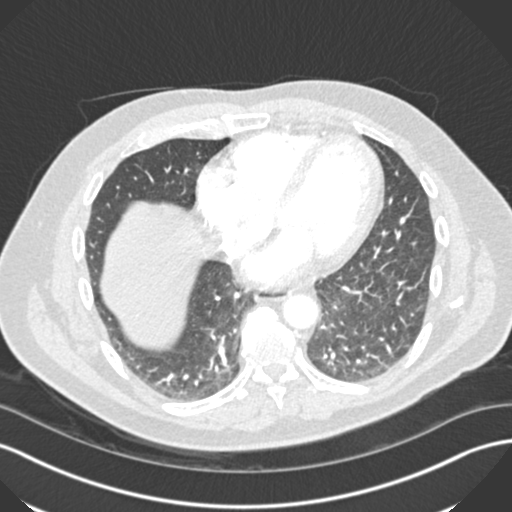
[im 132/275  soft-tissue]
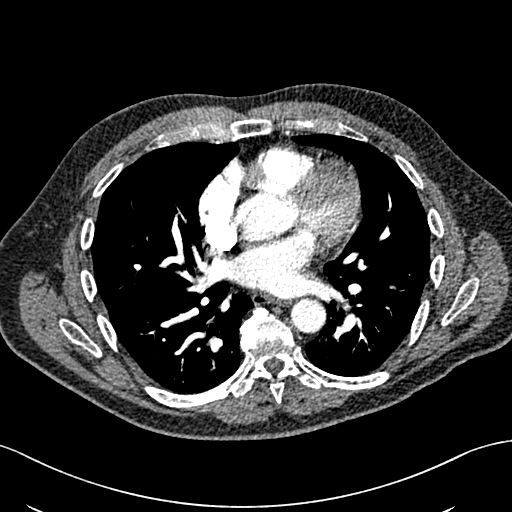
[im 143/275  lung]
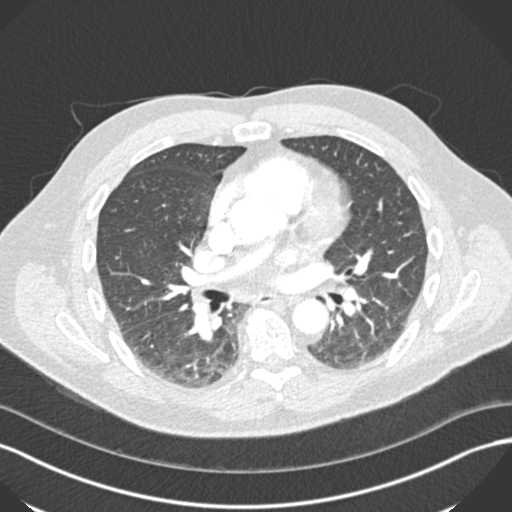
[im 167/275  soft-tissue]
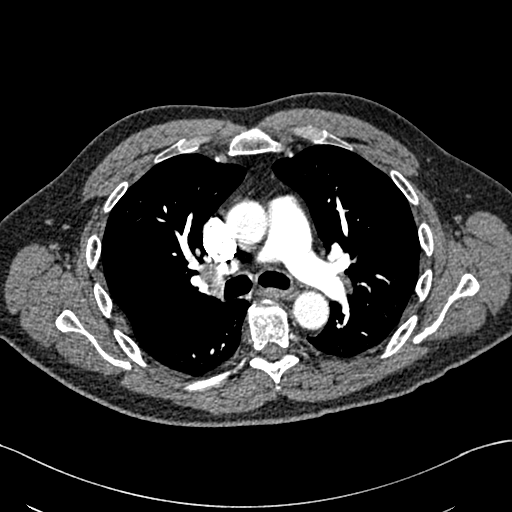
[im 179/275  lung]
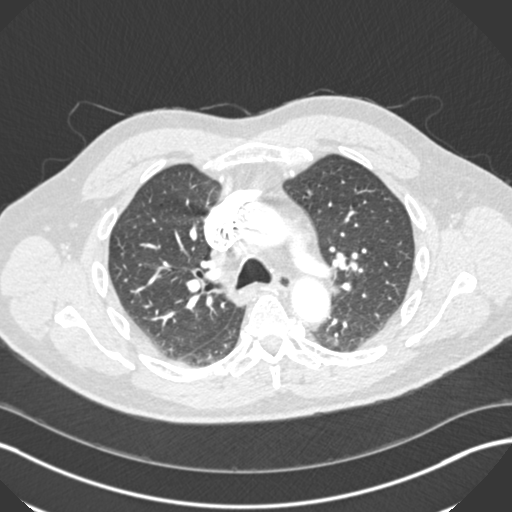
[im 191/275  soft-tissue]
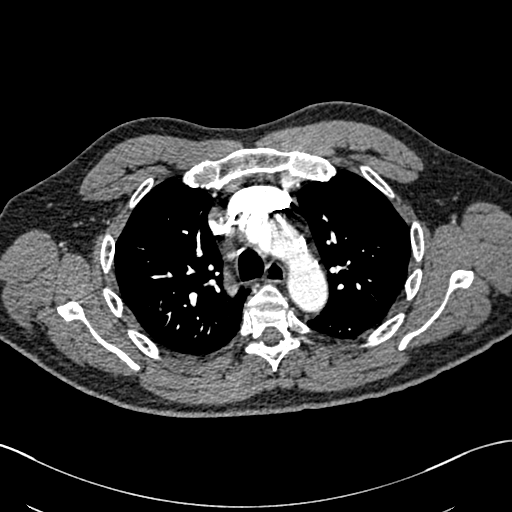
[im 215/275  lung]
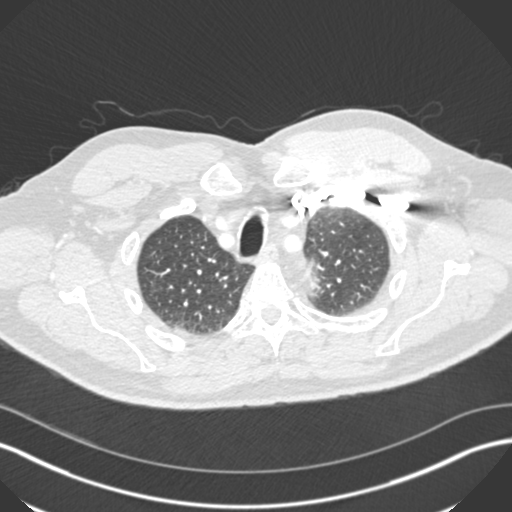
[im 227/275  soft-tissue]
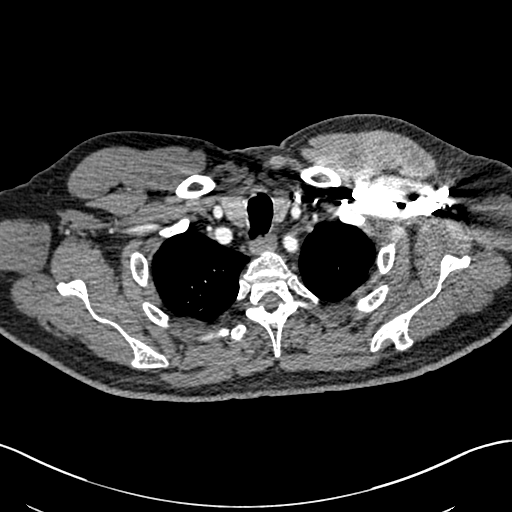
[im 239/275  lung]
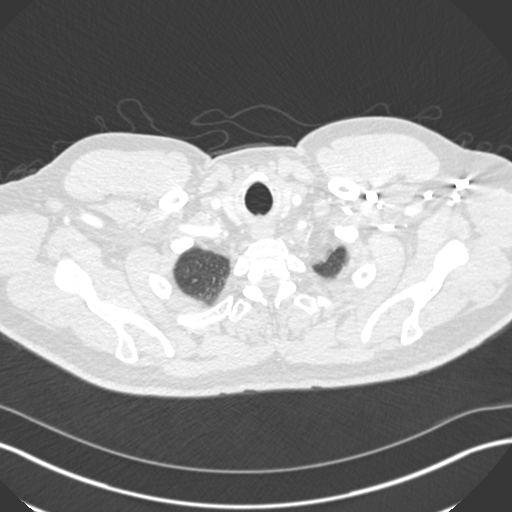
[im 263/275  soft-tissue]
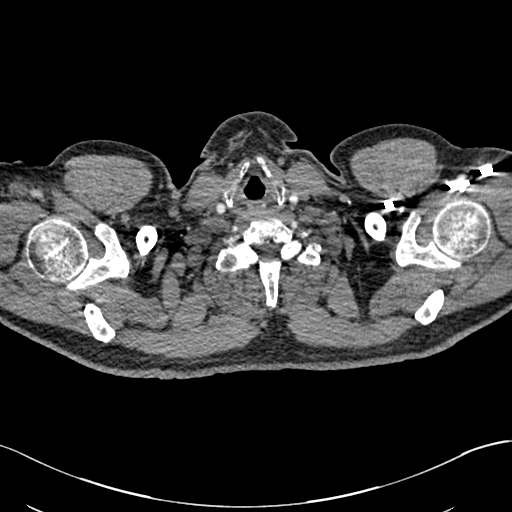

[Series 7: coronal mpr · coronal · 0.54mm/px · 2 of 106 slices shown]
[im 36/106  soft-tissue]
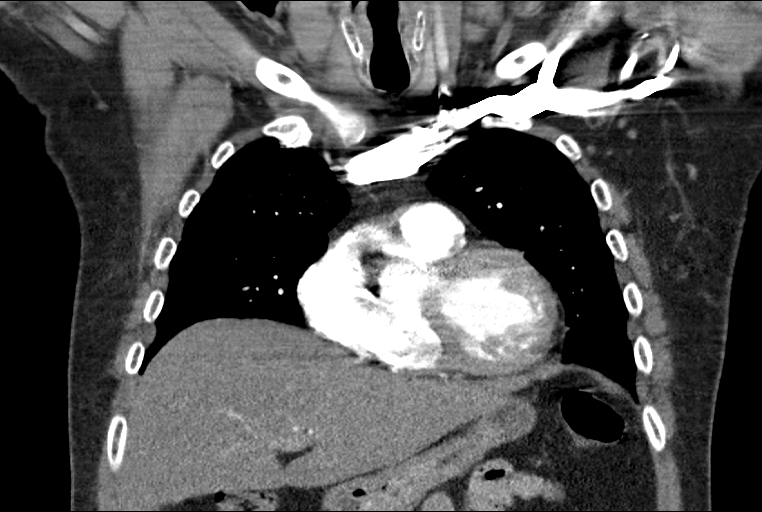
[im 71/106  soft-tissue]
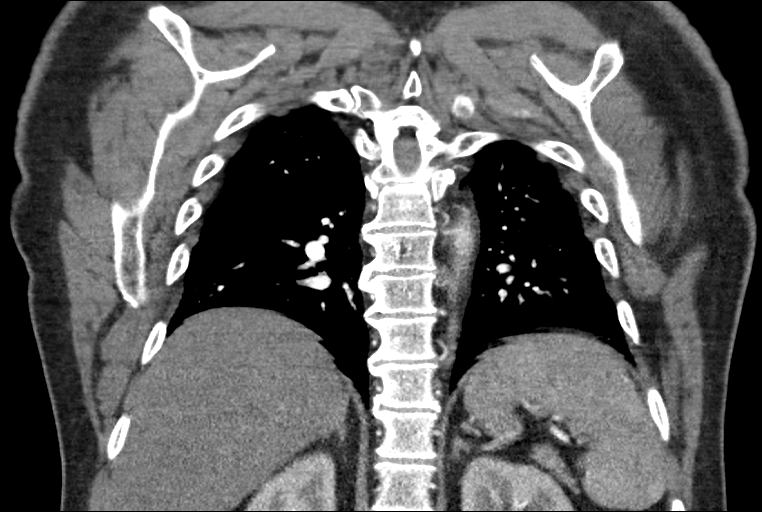

[18 of 46 positions shown; findings below may reference images not displayed]

FINDINGS: Cardiovascular: The heart size is normal. Atherosclerotic changes
are noted at the aorta without aneurysm or focal stenosis.

Pulmonary arterial opacification is excellent. There is no focal
filling defect to suggest pulmonary embolus.

Mediastinum/Nodes: No significant mediastinal or axillary adenopathy
is present. The esophagus is mildly dilated throughout the
mediastinum.

Lungs/Pleura: Multiple small nodules are present in the right upper
lobe. The largest measures 7 mm on image 34 of series 6.
Peribronchial wall thickening and scattered ground-glass attenuation
is present as well.

No other focal nodule, mass, or airspace disease is present. There
is mild dependent atelectasis bilaterally. No significant pleural
effusion is present.

Upper Abdomen: Limited imaging of the upper abdomen is within normal
limits.

Musculoskeletal: Bone windows are unremarkable. No focal lytic or
blastic lesions are present. Vertebral body heights and alignment
are normal. Large anterior osteophytes are present in the mid
thoracic spine.

Review of the MIP images confirms the above findings.
IMPRESSION: 1. Small nodules and ground-glass attenuation in the right upper
lobe are concerning for atypical infection. Recommend short-term
follow-up CT the chest without contrast following appropriate
therapy.
2. No pulmonary embolus.
3. Low lung volumes with mild dependent atelectasis bilaterally.
4. Mild atherosclerotic change.
